# Patient Record
Sex: Female | Born: 1965 | Race: White | Hispanic: No | Marital: Single | Smoking: Never smoker
Health system: Southern US, Community
[De-identification: ages and names within clinical notes are randomized; demographics above are authoritative.]

---

## 1997-11-04 ENCOUNTER — Inpatient Hospital Stay (HOSPITAL_COMMUNITY): Admission: AD | Admit: 1997-11-04 | Discharge: 1997-11-07 | Payer: Self-pay | Admitting: *Deleted

## 1998-01-07 ENCOUNTER — Inpatient Hospital Stay (HOSPITAL_COMMUNITY): Admission: AD | Admit: 1998-01-07 | Discharge: 1998-01-10 | Payer: Self-pay | Admitting: *Deleted

## 1998-01-24 ENCOUNTER — Inpatient Hospital Stay (HOSPITAL_COMMUNITY): Admission: AD | Admit: 1998-01-24 | Discharge: 1998-01-27 | Payer: Self-pay | Admitting: Obstetrics and Gynecology

## 2000-03-28 ENCOUNTER — Inpatient Hospital Stay (HOSPITAL_COMMUNITY): Admission: AD | Admit: 2000-03-28 | Discharge: 2000-03-28 | Payer: Self-pay | Admitting: Obstetrics and Gynecology

## 2000-03-29 ENCOUNTER — Inpatient Hospital Stay (HOSPITAL_COMMUNITY): Admission: AD | Admit: 2000-03-29 | Discharge: 2000-03-29 | Payer: Self-pay | Admitting: Obstetrics and Gynecology

## 2000-05-08 ENCOUNTER — Inpatient Hospital Stay (HOSPITAL_COMMUNITY): Admission: AD | Admit: 2000-05-08 | Discharge: 2000-05-10 | Payer: Self-pay | Admitting: Obstetrics & Gynecology

## 2000-05-19 ENCOUNTER — Encounter: Admission: RE | Admit: 2000-05-19 | Discharge: 2000-07-17 | Payer: Self-pay | Admitting: Obstetrics and Gynecology

## 2001-07-30 ENCOUNTER — Other Ambulatory Visit: Admission: RE | Admit: 2001-07-30 | Discharge: 2001-07-30 | Payer: Self-pay | Admitting: Obstetrics and Gynecology

## 2003-08-19 ENCOUNTER — Other Ambulatory Visit: Admission: RE | Admit: 2003-08-19 | Discharge: 2003-08-19 | Payer: Self-pay | Admitting: Obstetrics and Gynecology

## 2009-11-24 ENCOUNTER — Encounter: Admission: RE | Admit: 2009-11-24 | Discharge: 2009-11-24 | Payer: Self-pay | Admitting: Obstetrics and Gynecology

## 2010-03-25 ENCOUNTER — Emergency Department (HOSPITAL_BASED_OUTPATIENT_CLINIC_OR_DEPARTMENT_OTHER): Admission: EM | Admit: 2010-03-25 | Discharge: 2010-03-25 | Payer: Self-pay | Admitting: Emergency Medicine

## 2011-06-30 ENCOUNTER — Other Ambulatory Visit: Payer: Self-pay | Admitting: Obstetrics and Gynecology

## 2011-06-30 DIAGNOSIS — Z1231 Encounter for screening mammogram for malignant neoplasm of breast: Secondary | ICD-10-CM

## 2011-07-18 ENCOUNTER — Ambulatory Visit
Admission: RE | Admit: 2011-07-18 | Discharge: 2011-07-18 | Disposition: A | Discharge status: Home / Self Care | Payer: Managed Care, Other (non HMO) | Source: Ambulatory Visit | Attending: Obstetrics and Gynecology | Admitting: Obstetrics and Gynecology

## 2011-07-18 DIAGNOSIS — Z1231 Encounter for screening mammogram for malignant neoplasm of breast: Secondary | ICD-10-CM

## 2014-06-20 ENCOUNTER — Emergency Department (HOSPITAL_BASED_OUTPATIENT_CLINIC_OR_DEPARTMENT_OTHER)
Admission: EM | Admit: 2014-06-20 | Discharge: 2014-06-20 | Disposition: A | Discharge status: Home / Self Care | Payer: BLUE CROSS/BLUE SHIELD | Attending: Emergency Medicine | Admitting: Emergency Medicine

## 2014-06-20 ENCOUNTER — Encounter (HOSPITAL_BASED_OUTPATIENT_CLINIC_OR_DEPARTMENT_OTHER): Payer: Self-pay

## 2014-06-20 ENCOUNTER — Telehealth (HOSPITAL_BASED_OUTPATIENT_CLINIC_OR_DEPARTMENT_OTHER): Payer: Self-pay

## 2014-06-20 DIAGNOSIS — W228XXA Striking against or struck by other objects, initial encounter: Secondary | ICD-10-CM | POA: Insufficient documentation

## 2014-06-20 DIAGNOSIS — Y9389 Activity, other specified: Secondary | ICD-10-CM | POA: Insufficient documentation

## 2014-06-20 DIAGNOSIS — Y998 Other external cause status: Secondary | ICD-10-CM | POA: Insufficient documentation

## 2014-06-20 DIAGNOSIS — Z880 Allergy status to penicillin: Secondary | ICD-10-CM | POA: Diagnosis not present

## 2014-06-20 DIAGNOSIS — Y9289 Other specified places as the place of occurrence of the external cause: Secondary | ICD-10-CM | POA: Insufficient documentation

## 2014-06-20 DIAGNOSIS — S0181XA Laceration without foreign body of other part of head, initial encounter: Secondary | ICD-10-CM | POA: Insufficient documentation

## 2014-06-20 MED ORDER — LIDOCAINE-EPINEPHRINE-TETRACAINE (LET) SOLUTION
3.0000 mL | Freq: Once | NASAL | Status: AC
  Administered 2014-06-20: 3 mL via TOPICAL
  Filled 2014-06-20: qty 3

## 2014-06-20 MED ORDER — LIDOCAINE HCL (PF) 1 % IJ SOLN
INTRAMUSCULAR | Status: AC
  Filled 2014-06-20: qty 5
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
## 2014-06-20 NOTE — Discharge Instructions (Signed)
Follow up in 5 days for suture removal. Return sooner for any problems.
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
## 2014-06-21 ENCOUNTER — Telehealth (HOSPITAL_BASED_OUTPATIENT_CLINIC_OR_DEPARTMENT_OTHER): Payer: Self-pay | Admitting: Emergency Medicine

## 2014-06-21 NOTE — ED Notes (Signed)
Attempted to contact pt at [phone] and [phone] with no answer or availability to leave vm, to obtain what pharmacy pt would like keflex 500mg  po tid #20 to be filled.

## 2015-05-05 ENCOUNTER — Emergency Department (HOSPITAL_COMMUNITY): Payer: BLUE CROSS/BLUE SHIELD

## 2015-05-05 ENCOUNTER — Encounter (HOSPITAL_COMMUNITY): Payer: Self-pay | Admitting: Emergency Medicine

## 2015-05-05 ENCOUNTER — Emergency Department (HOSPITAL_COMMUNITY)
Admission: EM | Admit: 2015-05-05 | Discharge: 2015-05-05 | Disposition: A | Discharge status: Home / Self Care | Payer: BLUE CROSS/BLUE SHIELD | Attending: Emergency Medicine | Admitting: Emergency Medicine

## 2015-05-05 DIAGNOSIS — Y9389 Activity, other specified: Secondary | ICD-10-CM | POA: Diagnosis not present

## 2015-05-05 DIAGNOSIS — Z880 Allergy status to penicillin: Secondary | ICD-10-CM | POA: Insufficient documentation

## 2015-05-05 DIAGNOSIS — W3400XA Accidental discharge from unspecified firearms or gun, initial encounter: Secondary | ICD-10-CM | POA: Diagnosis not present

## 2015-05-05 DIAGNOSIS — Y9289 Other specified places as the place of occurrence of the external cause: Secondary | ICD-10-CM | POA: Insufficient documentation

## 2015-05-05 DIAGNOSIS — S81802A Unspecified open wound, left lower leg, initial encounter: Secondary | ICD-10-CM | POA: Insufficient documentation

## 2015-05-05 DIAGNOSIS — Y998 Other external cause status: Secondary | ICD-10-CM | POA: Diagnosis not present

## 2015-05-05 LAB — COMPREHENSIVE METABOLIC PANEL
ALBUMIN: 3.6 g/dL (ref 3.5–5.0)
ALT: 14 U/L (ref 14–54)
ANION GAP: 6 (ref 5–15)
AST: 20 U/L (ref 15–41)
Alkaline Phosphatase: 64 U/L (ref 38–126)
BUN: 12 mg/dL (ref 6–20)
CALCIUM: 8.5 mg/dL — AB (ref 8.9–10.3)
CHLORIDE: 109 mmol/L (ref 101–111)
CO2: 26 mmol/L (ref 22–32)
Creatinine, Ser: 0.91 mg/dL (ref 0.44–1.00)
GFR calc non Af Amer: >60 mL/min (ref >60)
GLUCOSE: 125 mg/dL — AB (ref 65–99)
POTASSIUM: 3.7 mmol/L (ref 3.5–5.1)
SODIUM: 141 mmol/L (ref 135–145)
Total Bilirubin: 0.4 mg/dL (ref 0.3–1.2)
Total Protein: 6.4 g/dL — ABNORMAL LOW (ref 6.5–8.1)

## 2015-05-05 LAB — CBC WITH DIFFERENTIAL/PLATELET
BASOS PCT: 0 %
Basophils Absolute: 0 10*3/uL (ref 0.0–0.1)
EOS ABS: 0.1 10*3/uL (ref 0.0–0.7)
EOS PCT: 1 %
HCT: 35.6 % — ABNORMAL LOW (ref 36.0–46.0)
Hemoglobin: 11.4 g/dL — ABNORMAL LOW (ref 12.0–15.0)
LYMPHS ABS: 1.1 10*3/uL (ref 0.7–4.0)
Lymphocytes Relative: 13 %
MCH: 29.9 pg (ref 26.0–34.0)
MCHC: 32 g/dL (ref 30.0–36.0)
MCV: 93.4 fL (ref 78.0–100.0)
MONOS PCT: 8 %
Monocytes Absolute: 0.6 10*3/uL (ref 0.1–1.0)
NEUTROS PCT: 78 %
Neutro Abs: 6.7 10*3/uL (ref 1.7–7.7)
PLATELETS: 259 10*3/uL (ref 150–400)
RBC: 3.81 MIL/uL — ABNORMAL LOW (ref 3.87–5.11)
RDW: 13.8 % (ref 11.5–15.5)
WBC: 8.5 10*3/uL (ref 4.0–10.5)

## 2015-05-05 MED ORDER — HYDROMORPHONE HCL 1 MG/ML IJ SOLN
1.0000 mg | Freq: Once | INTRAMUSCULAR | Status: AC
Start: 2015-05-05 — End: 2015-05-05
  Administered 2015-05-05: 1 mg via INTRAVENOUS
  Filled 2015-05-05: qty 1

## 2015-05-05 MED ORDER — TETANUS-DIPHTH-ACELL PERTUSSIS 5-2.5-18.5 LF-MCG/0.5 IM SUSP
0.5000 mL | Freq: Once | INTRAMUSCULAR | Status: AC
  Administered 2015-05-05: 0.5 mL via INTRAMUSCULAR
  Filled 2015-05-05: qty 0.5

## 2015-05-05 MED ORDER — OXYCODONE-ACETAMINOPHEN 5-325 MG PO TABS: 1.0000 | ORAL_TABLET | Freq: Four times a day (QID) | ORAL | Status: AC | PRN

## 2015-05-05 MED ORDER — CEPHALEXIN 500 MG PO CAPS: 500.0000 mg | ORAL_CAPSULE | Freq: Four times a day (QID) | ORAL | Status: AC

## 2015-05-05 MED ORDER — MORPHINE SULFATE (PF) 4 MG/ML IV SOLN: 4.0000 mg | Freq: Once | INTRAVENOUS | Status: DC
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
## 2015-05-05 NOTE — ED Notes (Signed)
Ortho tech at bedside 
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
## 2015-05-05 NOTE — Progress Notes (Addendum)
[facility] ED CM noted pt with coverage but no pcp listed Spoke with pt who confirms no pcp States that she has been seeing her ob gyn for most of her concerns [facility] has a Insurance claims handlerprivate practice  [facility] ED CM spoke with pt on how to obtain an in network pcp with insurance coverage via the customer service number or web site  Cm reviewed ED level of care for crisis/emergent services and community pcp level of care to manage continuous or chronic medical concerns.  The pt voiced understanding CM encouraged pt and discussed pt's responsibility to verify with pt's insurance carrier that any recommended medical provider offered by any emergency room or a hospital provider is within the carrier's network. The pt voiced understanding   Entered in d/c instructions Please go to [email or web address], locate find a doctor area to use to find in network primary care provider and specialists Schedule an appointment as soon as possible for a visit As needed Please verify any provider recommended to you is in network

## 2015-05-05 NOTE — ED Notes (Signed)
Reached out to a friend of the patient, Dorene SorrowJerry at [employer]. Dorene SorrowJerry was not in the salon but the secretary said he would pass the message along to see if they would be able to help.

## 2015-05-05 NOTE — ED Notes (Signed)
Patient transported to X-ray 
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
## 2015-05-05 NOTE — ED Notes (Signed)
Bed: [facility] Expected date:  Expected time:  Means of arrival:  Comments: 

## 2015-05-05 NOTE — ED Notes (Addendum)
Patient has an exit to the anterior lower leg.
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
## 2023-11-10 ENCOUNTER — Encounter: Payer: Self-pay | Admitting: Advanced Practice Midwife
